# Patient Record
Sex: Female | Born: 2005 | Race: Black or African American | Hispanic: No | Marital: Single | Smoking: Never smoker
Health system: Southern US, Community
[De-identification: ages and names within clinical notes are randomized; demographics above are authoritative.]

---
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
## 2024-05-23 ENCOUNTER — Telehealth: Payer: Self-pay | Admitting: Family Medicine

## 2024-05-23 NOTE — Telephone Encounter (Signed)
 Patient called us  today to schedule a GYN Visit. After explaining our availability, patient hung up the call. I attempted to call her back but she did not answer. At this time, patient has not been scheduled.

## 2024-05-30 ENCOUNTER — Ambulatory Visit
Admission: EM | Admit: 2024-05-30 | Discharge: 2024-05-30 | Disposition: A | Discharge status: Home / Self Care | Attending: Emergency Medicine | Admitting: Emergency Medicine

## 2024-05-30 ENCOUNTER — Encounter: Payer: Self-pay | Admitting: Emergency Medicine

## 2024-05-30 DIAGNOSIS — B349 Viral infection, unspecified: Secondary | ICD-10-CM

## 2024-05-30 DIAGNOSIS — J101 Influenza due to other identified influenza virus with other respiratory manifestations: Secondary | ICD-10-CM

## 2024-05-30 LAB — POCT INFLUENZA A/B
Influenza A, POC: POSITIVE — AB
Influenza B, POC: NEGATIVE

## 2024-05-30 MED ORDER — METOCLOPRAMIDE HCL 10 MG PO TABS: 10.0000 mg | ORAL_TABLET | Freq: Four times a day (QID) | ORAL | 0 refills | Status: AC

## 2024-05-30 MED ORDER — ALBUTEROL SULFATE HFA 108 (90 BASE) MCG/ACT IN AERS: 2.0000 | INHALATION_SPRAY | RESPIRATORY_TRACT | 1 refills | Status: AC | PRN

## 2024-05-30 MED ORDER — ALUM & MAG HYDROXIDE-SIMETH 200-200-20 MG/5ML PO SUSP: 30.0000 mL | Freq: Once | ORAL | Status: DC

## 2024-05-30 NOTE — Discharge Instructions (Addendum)
 They are most likely being caused by influenza A, your symptoms are consistent with the current presentation, influenza is a virus and will steadily improve with time  As you symptoms started 4 days ago unfortunately we are unable to use Tamiflu at this time  For stomach pain attempt use of Pepto-Bismol or similar products  For nausea May use Reglan every 6 hours as needed  For shortness of breath may use inhaler taking every 4-6 hours as needed    You can take Tylenol and/or Ibuprofen  as needed for fever reduction and pain relief.   For cough: honey 1/2 to 1 teaspoon (you can dilute the honey in water or another fluid).  You can also use guaifenesin and dextromethorphan for cough. You can use a humidifier for chest congestion and cough.  If you don't have a humidifier, you can sit in the bathroom with the hot shower running.      For sore throat: try warm salt water gargles, cepacol lozenges, throat spray, warm tea or water with lemon/honey, popsicles or ice, or OTC cold relief medicine for throat discomfort.   For congestion: take a daily anti-histamine like Zyrtec, Claritin, and a oral decongestant, such as pseudoephedrine.  You can also use Flonase 1-2 sprays in each nostril daily.   It is important to stay hydrated: drink plenty of fluids (water, gatorade/powerade/pedialyte, juices, or teas) to keep your throat moisturized and help further relieve irritation/discomfort.

## 2024-05-30 NOTE — ED Notes (Signed)
 Patient triage by provider Teresa Shelba SAUNDERS, NP

## 2024-05-30 NOTE — ED Provider Notes (Signed)
 [name]    CSN: [id number] Arrival date & time: 05/30/24  1623      History   Chief Complaint No chief complaint on file.   HPI Glenda Gilbert is a 18 y.o. female.    Patient presents for evaluation of subjective fever, nasal congestion, sore throat, productive cough, shortness of breath at rest and intermittent headaches present for 4 days.  No known sick contacts.  Tolerable to food and liquids but appetite is decreased.  Associated generalized abdominal pain and nausea without vomiting.  Has attempted use of TheraFlu and Tylenol without improvement.    No past medical history on file.  There are no active problems to display for this patient.   No past surgical history on file.  OB History   No obstetric history on file.      Home Medications    Prior to Admission medications   Medication Sig Start Date End Date Taking? Authorizing Provider  ondansetron  (ZOFRAN -ODT) 4 MG disintegrating tablet Take 4 mg by mouth every 8 (eight) hours as needed. 02/10/23   [provider]  ondansetron  (ZOFRAN -ODT) 4 MG disintegrating tablet Take 1 tablet (4 mg total) by mouth every 8 (eight) hours as needed for nausea or vomiting. 02/16/23   Delores Nidia CROME, FNP  promethazine  (PHENERGAN ) 25 MG tablet Take 1 tablet (25 mg total) by mouth every 6 (six) hours as needed for nausea or vomiting. 02/16/23   Delores Nidia CROME, FNP    Family History No family history on file.  Social History Social History   Tobacco Use   Smoking status: Never   Smokeless tobacco: Never  Substance Use Topics   Alcohol use: Never   Drug use: Yes    Types: Marijuana     Allergies   Patient has no known allergies.   Review of Systems Review of Systems  Constitutional:  Positive for fever. Negative for activity change, appetite change, chills, diaphoresis, fatigue and unexpected weight change.  HENT:  Positive for congestion and sore throat. Negative for dental problem,  drooling, ear discharge, ear pain, facial swelling, hearing loss, mouth sores, nosebleeds, postnasal drip, rhinorrhea, sinus pressure, sinus pain, sneezing, tinnitus, trouble swallowing and voice change.   Respiratory:  Positive for cough and shortness of breath. Negative for apnea, choking, chest tightness, wheezing and stridor.   Gastrointestinal: Negative.   Neurological:  Positive for headaches. Negative for dizziness, tremors, seizures, syncope, facial asymmetry, speech difficulty, weakness, light-headedness and numbness.     Physical Exam Triage Vital Signs ED Triage Vitals  Encounter Vitals Group     BP      Girls Systolic BP Percentile      Girls Diastolic BP Percentile      Boys Systolic BP Percentile      Boys Diastolic BP Percentile      Pulse      Resp      Temp      Temp src      SpO2      Weight      Height      Head Circumference      Peak Flow      Pain Score      Pain Loc      Pain Education      Exclude from Growth Chart    No data found.  Updated Vital Signs There were no vitals taken for this visit.  Visual Acuity Right Eye Distance:   Left Eye Distance:   Bilateral  Distance:    Right Eye Near:   Left Eye Near:    Bilateral Near:     Physical Exam Constitutional:      Appearance: Normal appearance.  HENT:     Right Ear: Tympanic membrane, ear canal and external ear normal.     Left Ear: Tympanic membrane, ear canal and external ear normal.     Nose: Congestion present.     Mouth/Throat:     Mouth: Mucous membranes are moist.     Pharynx: Oropharynx is clear. No oropharyngeal exudate or posterior oropharyngeal erythema.  Eyes:     Extraocular Movements: Extraocular movements intact.  Cardiovascular:     Rate and Rhythm: Normal rate and regular rhythm.     Pulses: Normal pulses.     Heart sounds: Normal heart sounds.  Pulmonary:     Effort: Pulmonary effort is normal.     Breath sounds: Normal breath sounds.  Abdominal:     General:  Bowel sounds are increased.     Tenderness: There is generalized abdominal tenderness.  Neurological:     Mental Status: She is alert and oriented to person, place, and time.      UC Treatments / Results  Labs (all labs ordered are listed, but only abnormal results are displayed) Labs Reviewed - No data to display  EKG   Radiology No results found.  Procedures Procedures (including critical care time)  Medications Ordered in UC Medications - No data to display  Initial Impression / Assessment and Plan / UC Course  I have reviewed the triage vital signs and the nursing notes.  Pertinent labs & imaging results that were available during my care of the patient were reviewed by me and considered in my medical decision making (see chart for details).  Influenza A, viral illness  Vital signs stable, patient in no signs of distress nor toxic appearing, increased bowel sounds and generalized abdominal tenderness noted on exam, offered Zofran  and Maalox, declined, flu testing positive, discussed findings, symptoms present for 4 days, past window of Tamiflu, discussed, prescribed albuterol inhaler for management of shortness of breath, declined steroids, sometimes marijuana use but denies use of tobacco, prescribed Reglan for management of nausea advised to increase fluid intake and recommend over-the-counter medications for abdominal pain, recommended nonpharmacological measures and advised follow-up for any further concerns Final Clinical Impressions(s) / UC Diagnoses   Final diagnoses:  None   Discharge Instructions   None    ED Prescriptions   None    PDMP not reviewed this encounter.   Teresa Shelba SAUNDERS, NP 05/30/24 [phone]
